# Patient Record
Sex: Male | Born: 1960 | Race: White | Hispanic: No | Marital: Married | Smoking: Never smoker
Health system: Southern US, Community
[De-identification: ages and names within clinical notes are randomized; demographics above are authoritative.]

## PROBLEM LIST (undated history)

## (undated) DIAGNOSIS — K5090 Crohn's disease, unspecified, without complications: Secondary | ICD-10-CM

## (undated) DIAGNOSIS — K648 Other hemorrhoids: Secondary | ICD-10-CM

## (undated) DIAGNOSIS — K635 Polyp of colon: Secondary | ICD-10-CM

## (undated) DIAGNOSIS — J329 Chronic sinusitis, unspecified: Secondary | ICD-10-CM

## (undated) DIAGNOSIS — I10 Essential (primary) hypertension: Secondary | ICD-10-CM

## (undated) DIAGNOSIS — K56699 Other intestinal obstruction unspecified as to partial versus complete obstruction: Secondary | ICD-10-CM

## (undated) DIAGNOSIS — J039 Acute tonsillitis, unspecified: Secondary | ICD-10-CM

## (undated) HISTORY — DX: Polyp of colon: K63.5

## (undated) HISTORY — DX: Chronic sinusitis, unspecified: J32.9

## (undated) HISTORY — DX: Acute tonsillitis, unspecified: J03.90

## (undated) HISTORY — PX: CHOLECYSTECTOMY: SHX55

## (undated) HISTORY — DX: Other hemorrhoids: K64.8

## (undated) HISTORY — DX: Crohn's disease, unspecified, without complications: K50.90

## (undated) HISTORY — DX: Other intestinal obstruction unspecified as to partial versus complete obstruction: K56.699

## (undated) HISTORY — DX: Essential (primary) hypertension: I10

---

## 2007-08-01 ENCOUNTER — Encounter: Admission: RE | Admit: 2007-08-01 | Discharge: 2007-08-01 | Payer: Self-pay | Admitting: Gastroenterology

## 2008-06-18 ENCOUNTER — Encounter: Admission: RE | Admit: 2008-06-18 | Discharge: 2008-06-18 | Payer: Self-pay | Admitting: Gastroenterology

## 2010-07-13 ENCOUNTER — Other Ambulatory Visit: Payer: Self-pay | Admitting: Gastroenterology

## 2010-07-13 DIAGNOSIS — K5090 Crohn's disease, unspecified, without complications: Secondary | ICD-10-CM

## 2010-08-03 ENCOUNTER — Other Ambulatory Visit: Payer: Self-pay

## 2010-08-15 ENCOUNTER — Other Ambulatory Visit: Payer: Self-pay

## 2010-08-23 ENCOUNTER — Ambulatory Visit
Admission: RE | Admit: 2010-08-23 | Discharge: 2010-08-23 | Disposition: A | Discharge status: Home / Self Care | Payer: 59 | Source: Ambulatory Visit | Attending: Gastroenterology | Admitting: Gastroenterology

## 2010-08-23 DIAGNOSIS — K5090 Crohn's disease, unspecified, without complications: Secondary | ICD-10-CM

## 2010-08-30 ENCOUNTER — Encounter (INDEPENDENT_AMBULATORY_CARE_PROVIDER_SITE_OTHER): Payer: Self-pay

## 2010-08-30 ENCOUNTER — Encounter (INDEPENDENT_AMBULATORY_CARE_PROVIDER_SITE_OTHER): Payer: Self-pay | Admitting: General Surgery

## 2010-08-30 ENCOUNTER — Ambulatory Visit (INDEPENDENT_AMBULATORY_CARE_PROVIDER_SITE_OTHER): Payer: 59 | Admitting: General Surgery

## 2010-08-30 VITALS — BP 128/82 | HR 88 | Ht 71.0 in | Wt 200.2 lb

## 2010-08-30 DIAGNOSIS — K5289 Other specified noninfective gastroenteritis and colitis: Secondary | ICD-10-CM

## 2010-08-30 DIAGNOSIS — K529 Noninfective gastroenteritis and colitis, unspecified: Secondary | ICD-10-CM

## 2010-08-30 NOTE — Progress Notes (Signed)
Herbert Cameron is a 50 y.o. male.    No chief complaint on file.   HPI HPI The patient comes to me with a history of a transverse colon stricture. I had seen the patient approximately 2 years ago for the same problem at which time he was completely asymptomatic. As the heart of his continuing followup she had a repeat virtual colonoscopy which demonstrated progression of the stricture in the transverse colon. They do have some concerns of a possible malignancy however this seems less likely since the patient didn't have an actual colonoscopy performed 2 years ago .  The patient continues to be completely asymptomatic. He has no abdominal pain, no bloating, no blood in his stools. However because of the progression of the stricturing on the virtual colonoscopy and the possibility of an occult malignancy we are recommending that he have a transverse colectomy.  Past Medical History  Diagnosis Date  . Crohn's disease   . Sinusitis   . Tonsillitis   . Colonic stricture   . Colon polyp   . Internal hemorrhoids     History reviewed. No pertinent past surgical history.  History reviewed. No pertinent family history.  Social History History  Substance Use Topics  . Smoking status: Never Smoker   . Smokeless tobacco: Not on file  . Alcohol Use: Not on file    Allergies  Allergen Reactions  . Codeine   . Iohexol      Code: HIVES, Desc: pt had iv contrast 26 yrs ago and broke out in hives and had a syncopal episode.  we did him w/o iv contrast today., Onset Date: [id number]   5  Current Outpatient Prescriptions  Medication Sig Dispense Refill  . mesalamine (LIALDA) 1.2 G EC tablet Take 1,200 mg by mouth daily with breakfast.        . finasteride (PROSCAR) 5 MG tablet       . ketoconazole (NIZORAL) 2 % shampoo         Review of Systems Review of Systems  Constitutional: Negative.   HENT: Negative.   Eyes: Negative.   Respiratory: Negative.   Cardiovascular: Negative.     Gastrointestinal: Negative for heartburn, nausea, vomiting, abdominal pain, diarrhea, constipation (Never any constipation), blood in stool (guaiac negative also) and melena.  Musculoskeletal: Negative.   Skin: Negative.   Neurological: Negative.   Endo/Heme/Allergies: Negative.   Psychiatric/Behavioral: Negative.     Physical Exam Physical Exam  Constitutional: He is oriented to person, place, and time. He appears well-developed and well-nourished.  HENT:  Head: Normocephalic and atraumatic.  Eyes: EOM are normal. Pupils are equal, round, and reactive to light.  Neck: Normal range of motion. Neck supple.  Cardiovascular: Normal rate, regular rhythm and normal heart sounds.   Respiratory: Effort normal and breath sounds normal.  GI: Soft. Bowel sounds are normal. He exhibits no distension and no mass. There is no tenderness. There is no rebound.  Genitourinary: Penis normal.  Musculoskeletal: Normal range of motion.  Neurological: He is alert and oriented to person, place, and time. He has normal reflexes.  Skin: Skin is warm and dry.  Psychiatric: He has a normal mood and affect. His behavior is normal. Judgment and thought content normal.     Blood pressure 128/82, pulse 88, height 5\' 11"  (1.803 m), weight 200 lb 3.2 oz (90.81 kg).  Assessment/Plan Currently the plan is to perform a right transverse colectomy. The patient will get a two-day bowel prep. He had been currently scheduled  for September 08, 2010   The plan is to perform a transverse colectomy the risks and benefits have been explained to the patient and his wife and we'll proceed  July Nickson III,Vicent Febles O 08/30/2010, 2:39 PM

## 2010-09-05 ENCOUNTER — Encounter (HOSPITAL_COMMUNITY)
Admission: RE | Admit: 2010-09-05 | Discharge: 2010-09-05 | Disposition: A | Discharge status: Home / Self Care | Payer: 59 | Source: Ambulatory Visit | Attending: General Surgery | Admitting: General Surgery

## 2010-09-05 ENCOUNTER — Other Ambulatory Visit (INDEPENDENT_AMBULATORY_CARE_PROVIDER_SITE_OTHER): Payer: Self-pay | Admitting: General Surgery

## 2010-09-05 ENCOUNTER — Ambulatory Visit (HOSPITAL_COMMUNITY)
Admission: RE | Admit: 2010-09-05 | Discharge: 2010-09-05 | Disposition: A | Discharge status: Home / Self Care | Payer: 59 | Source: Ambulatory Visit | Attending: General Surgery | Admitting: General Surgery

## 2010-09-05 DIAGNOSIS — Z01812 Encounter for preprocedural laboratory examination: Secondary | ICD-10-CM | POA: Insufficient documentation

## 2010-09-05 DIAGNOSIS — Z01810 Encounter for preprocedural cardiovascular examination: Secondary | ICD-10-CM | POA: Insufficient documentation

## 2010-09-05 DIAGNOSIS — Z01818 Encounter for other preprocedural examination: Secondary | ICD-10-CM | POA: Insufficient documentation

## 2010-09-05 DIAGNOSIS — K56609 Unspecified intestinal obstruction, unspecified as to partial versus complete obstruction: Secondary | ICD-10-CM | POA: Insufficient documentation

## 2010-09-05 DIAGNOSIS — K56699 Other intestinal obstruction unspecified as to partial versus complete obstruction: Secondary | ICD-10-CM

## 2010-09-05 LAB — CBC
HCT: 42.1 % (ref 39.0–52.0)
Hemoglobin: 14.7 g/dL (ref 13.0–17.0)
MCH: 31.7 pg (ref 26.0–34.0)
MCHC: 34.9 g/dL (ref 30.0–36.0)
MCV: 90.7 fL (ref 78.0–100.0)
Platelets: 302 10*3/uL (ref 150–400)
RBC: 4.64 MIL/uL (ref 4.22–5.81)
RDW: 12.6 % (ref 11.5–15.5)
WBC: 6.7 10*3/uL (ref 4.0–10.5)

## 2010-09-05 LAB — COMPREHENSIVE METABOLIC PANEL
ALT: 16 U/L (ref 0–53)
AST: 16 U/L (ref 0–37)
Albumin: 3.8 g/dL (ref 3.5–5.2)
Alkaline Phosphatase: 74 U/L (ref 39–117)
BUN: 16 mg/dL (ref 6–23)
CO2: 27 mEq/L (ref 19–32)
Calcium: 9.5 mg/dL (ref 8.4–10.5)
Chloride: 104 mEq/L (ref 96–112)
Creatinine, Ser: 0.84 mg/dL (ref 0.50–1.35)
GFR calc Af Amer: >60 mL/min (ref >60)
GFR calc non Af Amer: >60 mL/min (ref >60)
Glucose, Bld: 96 mg/dL (ref 70–99)
Potassium: 5.3 mEq/L — ABNORMAL HIGH (ref 3.5–5.1)
Sodium: 139 mEq/L (ref 135–145)
Total Bilirubin: 0.4 mg/dL (ref 0.3–1.2)
Total Protein: 6.8 g/dL (ref 6.0–8.3)

## 2010-09-05 LAB — TYPE AND SCREEN: Antibody Screen: NEGATIVE

## 2010-09-05 LAB — PROTIME-INR: Prothrombin Time: 13.3 seconds (ref 11.6–15.2)

## 2010-09-05 LAB — SURGICAL PCR SCREEN: MRSA, PCR: NEGATIVE

## 2010-09-05 LAB — DIFFERENTIAL
Basophils Relative: 0 % (ref 0–1)
Lymphs Abs: 0.9 10*3/uL (ref 0.7–4.0)
Monocytes Relative: 7 % (ref 3–12)
Neutro Abs: 5.2 10*3/uL (ref 1.7–7.7)
Neutrophils Relative %: 77 % (ref 43–77)

## 2010-09-05 LAB — ABO/RH: ABO/RH(D): B NEG

## 2010-09-08 ENCOUNTER — Other Ambulatory Visit (INDEPENDENT_AMBULATORY_CARE_PROVIDER_SITE_OTHER): Payer: Self-pay | Admitting: General Surgery

## 2010-09-08 ENCOUNTER — Inpatient Hospital Stay (HOSPITAL_COMMUNITY)
Admission: RE | Admit: 2010-09-08 | Discharge: 2010-09-13 | DRG: 330 | Disposition: A | Discharge status: Home / Self Care | Payer: 59 | Source: Ambulatory Visit | Attending: General Surgery | Admitting: General Surgery

## 2010-09-08 DIAGNOSIS — Z860100 Personal history of colon polyps, unspecified: Secondary | ICD-10-CM

## 2010-09-08 DIAGNOSIS — K429 Umbilical hernia without obstruction or gangrene: Secondary | ICD-10-CM | POA: Diagnosis present

## 2010-09-08 DIAGNOSIS — K5010 Crohn's disease of large intestine without complications: Principal | ICD-10-CM | POA: Diagnosis present

## 2010-09-08 DIAGNOSIS — Z01810 Encounter for preprocedural cardiovascular examination: Secondary | ICD-10-CM

## 2010-09-08 DIAGNOSIS — Z01812 Encounter for preprocedural laboratory examination: Secondary | ICD-10-CM

## 2010-09-08 DIAGNOSIS — K5090 Crohn's disease, unspecified, without complications: Secondary | ICD-10-CM

## 2010-09-08 DIAGNOSIS — Z01818 Encounter for other preprocedural examination: Secondary | ICD-10-CM

## 2010-09-08 DIAGNOSIS — K56609 Unspecified intestinal obstruction, unspecified as to partial versus complete obstruction: Secondary | ICD-10-CM | POA: Diagnosis present

## 2010-09-08 DIAGNOSIS — K648 Other hemorrhoids: Secondary | ICD-10-CM | POA: Diagnosis present

## 2010-09-08 DIAGNOSIS — K660 Peritoneal adhesions (postprocedural) (postinfection): Secondary | ICD-10-CM | POA: Diagnosis present

## 2010-09-09 LAB — CBC
HCT: 39.4 % (ref 39.0–52.0)
Hemoglobin: 13.9 g/dL (ref 13.0–17.0)
RBC: 4.53 MIL/uL (ref 4.22–5.81)
WBC: 20.2 10*3/uL — ABNORMAL HIGH (ref 4.0–10.5)

## 2010-09-09 LAB — BASIC METABOLIC PANEL
BUN: 16 mg/dL (ref 6–23)
Chloride: 99 mEq/L (ref 96–112)
GFR calc non Af Amer: >60 mL/min (ref >60)
Glucose, Bld: 269 mg/dL — ABNORMAL HIGH (ref 70–99)
Potassium: 5.6 mEq/L — ABNORMAL HIGH (ref 3.5–5.1)
Sodium: 131 mEq/L — ABNORMAL LOW (ref 135–145)

## 2010-09-09 LAB — DIFFERENTIAL
Basophils Absolute: 0 10*3/uL (ref 0.0–0.1)
Basophils Relative: 0 % (ref 0–1)
Lymphocytes Relative: 2 % — ABNORMAL LOW (ref 12–46)
Neutro Abs: 17.5 10*3/uL — ABNORMAL HIGH (ref 1.7–7.7)
Neutrophils Relative %: 87 % — ABNORMAL HIGH (ref 43–77)

## 2010-09-10 LAB — CBC
HCT: 33 % — ABNORMAL LOW (ref 39.0–52.0)
Hemoglobin: 11.5 g/dL — ABNORMAL LOW (ref 13.0–17.0)
MCH: 31 pg (ref 26.0–34.0)
MCHC: 34.8 g/dL (ref 30.0–36.0)
RBC: 3.71 MIL/uL — ABNORMAL LOW (ref 4.22–5.81)

## 2010-09-10 LAB — BASIC METABOLIC PANEL
BUN: 13 mg/dL (ref 6–23)
CO2: 30 mEq/L (ref 19–32)
GFR calc non Af Amer: >60 mL/min (ref >60)
Glucose, Bld: 141 mg/dL — ABNORMAL HIGH (ref 70–99)
Potassium: 4.6 mEq/L (ref 3.5–5.1)
Sodium: 134 mEq/L — ABNORMAL LOW (ref 135–145)

## 2010-09-11 NOTE — Op Note (Signed)
NAMEDEMETRY, BENDICKSON              ACCOUNT NO.:  [id number]  MEDICAL RECORD NO.:  [id number]  LOCATION:  5123                         FACILITY:  [facility]  PHYSICIAN:  Cherylynn Ridges, M.D.    DATE OF BIRTH:  1960-08-11  DATE OF PROCEDURE: DATE OF DISCHARGE:                              OPERATIVE REPORT   Herbert patient of Dr. Charna Cameron.  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  PROCEDURE:  Transverse colectomy with primary anastomosis and mobilization of splenic flexure.  SURGEON:  Cherylynn Ridges, MD  ASSISTANT:  Dr. Magnus Ivan.  ANESTHESIA:  General endotracheal.  ESTIMATED BLOOD LOSS:  100 mL.  COMPLICATIONS:  None.  CONDITION:  Stable.  FINDINGS:  Markedly inflamed and strictured midtransverse colon without evidence of acute perforation.  INDICATIONS FOR OPERATION:  Herbert patient is a 50 year old with minimal symptoms who comes in with a known transverse colon stricture which has worsened over last year who now comes in for transverse colectomy.  OPERATION:  Herbert patient was taken to Herbert operating room and placed on table in supine position.  After an adequate general endotracheal anesthetic was administered, he was prepped and draped in usual sterile manner exposing Herbert midline of Herbert abdomen.  After proper time-out was performed identifying Herbert patient and Herbert procedure to be done, we made a midline incision from Herbert umbilicus from Herbert xiphoid just down to Herbert right of Herbert umbilicus.  Herbert patient was known to have an umbilical hernia and this was incorporated into Herbert closure for repair Herbert umbilical hernia defect.  Upon entering Herbert peritoneal cavity, Herbert patient have what appeared to be significant inflammation and omental adhesions to Herbert midtransverse colon.  Mobilizing this require taking down Herbert omentum and leaving some attached  to Herbert strictured and inflamed area and also mobilizing Herbert splenic flexure.  During that process, there was a very small less than a centimeter tear of Herbert splenic capsule on Herbert edge which was cauterized and covered with Surgicel SNoW.  We mobilized Herbert splenic flexure and then also mobilized Herbert transverse colon from Herbert omentum centrally and then also mobilized right colon down from Herbert hepatic flexure.  We used a GIA proximally and distal to this strictured area after mobilizing Herbert mesentery of Herbert transverse colon.  This left Herbert mesentery as Herbert only attachment to Herbert body after we had taken down Herbert omental adhesions.  We used a LigaSure device and also 2-0 silk ties in order to mobilize Herbert mesentery and then was resected specimen.  Once this was done again mobilizing Herbert splenic flexure in Herbert right colon was made sure in order to perform a side-to- side anastomosis using a GIA 75 stapler.  Herbert resulting enterotomy was closed using a TA-60 stapler.  We closed Herbert mesentery using interrupted 2-0 silk sutures.  Once this was done, we irrigated with saline after changing our gloves as Herbert assistant and Herbert surgeon.  There was no tension on Herbert anastomosis at all.  We inspected all areas.  Exploration did not demonstrate any evidence of any tumor anywhere, no evidence of anything in  Herbert liver and Herbert gallbladder palpated as normal.  A small tear on Herbert splenic capsule was not bleeding with pressure and with a small piece of Surgicel SNoW.  We irrigated with saline solution and then closed.  We closed Herbert fascia using running looped #1 PDS suture again incorporating Herbert previous hernia, umbilical hernia defect into Herbert abdominal closure.  We irrigated subcu with saline and then we closed Herbert skin using a running subcuticular stitch of 3-0 Monocryl after injecting Herbert wound with 20 mL of 0.25% Marcaine with epinephrine.  All counts were correct including needles, sponges and  instruments.     Cherylynn Ridges, M.D.     JOW/MEDQ  D:  09/08/2010  T:  09/09/2010  Job:  [id number]  cc:   Anselmo Rod, MD, [facility]  Electronically Signed by Jimmye Norman M.D. on 09/11/2010 03:33:36 PM

## 2010-09-12 LAB — DIFFERENTIAL
Basophils Absolute: 0 10*3/uL (ref 0.0–0.1)
Basophils Relative: 0 % (ref 0–1)
Eosinophils Absolute: 0.1 10*3/uL (ref 0.0–0.7)
Eosinophils Relative: 2 % (ref 0–5)
Lymphocytes Relative: 15 % (ref 12–46)
Lymphs Abs: 0.8 10*3/uL (ref 0.7–4.0)
Monocytes Absolute: 0.6 10*3/uL (ref 0.1–1.0)
Monocytes Relative: 11 % (ref 3–12)
Neutro Abs: 4 10*3/uL (ref 1.7–7.7)
Neutrophils Relative %: 72 % (ref 43–77)

## 2010-09-12 LAB — CBC
HCT: 30.9 % — ABNORMAL LOW (ref 39.0–52.0)
Hemoglobin: 10.6 g/dL — ABNORMAL LOW (ref 13.0–17.0)
MCH: 30.9 pg (ref 26.0–34.0)
MCHC: 34.3 g/dL (ref 30.0–36.0)
MCV: 90.1 fL (ref 78.0–100.0)
RDW: 12.3 % (ref 11.5–15.5)

## 2010-09-12 LAB — BASIC METABOLIC PANEL
BUN: 7 mg/dL (ref 6–23)
Chloride: 102 mEq/L (ref 96–112)
GFR calc Af Amer: >60 mL/min (ref >60)
GFR calc non Af Amer: >60 mL/min (ref >60)
Potassium: 4.6 mEq/L (ref 3.5–5.1)
Sodium: 139 mEq/L (ref 135–145)

## 2010-09-13 ENCOUNTER — Telehealth (INDEPENDENT_AMBULATORY_CARE_PROVIDER_SITE_OTHER): Payer: Self-pay | Admitting: General Surgery

## 2010-09-26 ENCOUNTER — Encounter (INDEPENDENT_AMBULATORY_CARE_PROVIDER_SITE_OTHER): Payer: Self-pay | Admitting: General Surgery

## 2010-09-26 ENCOUNTER — Ambulatory Visit (INDEPENDENT_AMBULATORY_CARE_PROVIDER_SITE_OTHER): Payer: 59 | Admitting: General Surgery

## 2010-09-26 DIAGNOSIS — Z09 Encounter for follow-up examination after completed treatment for conditions other than malignant neoplasm: Secondary | ICD-10-CM

## 2010-09-26 NOTE — Progress Notes (Signed)
HPI The patient is status post transverse colectomy for an inflammatory stricture of his mid transverse colon pathology has revealed that this is likely an inflammatory acute and chronic process possibly Crohn's disease.  PE On examination today he has some mild left upper quadrant tenderness but good bowel sounds. His midline wound has healed well with no evidence of infection and there is no drainage I removed a bandage an incision looks well-healed subcuticular midline incision.  Studiy review None  Assessment Doing well status post transverse colectomy with primary anastomosis. The patient did have several days of gaseous buildup which is treated with likely symmetrical by Dr. Loreta Ave to  Plan Our reassess the patient in 2 weeks at which time I will give him his work release at that time which will be October 30, 2010 at that time he will be able to lift greater than 25 pounds.

## 2010-10-17 ENCOUNTER — Encounter (INDEPENDENT_AMBULATORY_CARE_PROVIDER_SITE_OTHER): Payer: Self-pay | Admitting: General Surgery

## 2010-10-17 ENCOUNTER — Ambulatory Visit (INDEPENDENT_AMBULATORY_CARE_PROVIDER_SITE_OTHER): Payer: 59 | Admitting: General Surgery

## 2010-10-17 VITALS — BP 126/88 | HR 66

## 2010-10-17 DIAGNOSIS — Z09 Encounter for follow-up examination after completed treatment for conditions other than malignant neoplasm: Secondary | ICD-10-CM

## 2010-10-17 NOTE — Progress Notes (Signed)
Addended by: Cherylynn Ridges III on: 10/17/2010 01:18 PM   Modules accepted: Orders, Medications

## 2010-10-17 NOTE — Progress Notes (Signed)
HPI The patient is status post transverse colectomy and doing very well. He averages 2-3 bowel movements a day. He has no problems with constipation.  PE His midline wound has healed very well with no evidence of infection and or hernia formation.  Studiy review None.  Assessment  Status post transverse colectomy.  Plan I will see the patient on a p.r.n. basis.

## 2010-11-28 NOTE — Discharge Summary (Signed)
  NAMETROI, FLORENDO              ACCOUNT NO.:  [id number]  MEDICAL RECORD NO.:  [id number]  LOCATION:  5123                         FACILITY:  [facility]  PHYSICIAN:  Cherylynn Ridges, M.D.    DATE OF BIRTH:  October 17, 1960  DATE OF ADMISSION:  09/08/2010 DATE OF DISCHARGE:  09/13/2010                              DISCHARGE SUMMARY   DISCHARGE DIAGNOSIS:  Chronic active colitis with stricture consistent with Crohn disease.  PRINCIPAL PROCEDURE:  Mid to right transverse colectomy with primary anastomosis.  SURGEON:  Cherylynn Ridges, M.D.  DISCHARGE MEDICATIONS:  He was discharged home on Ultram and Tylenol p.r.n. for pain.  DISCHARGE DIET:  Soft.  CONDITION:  Stable.  BRIEF SUMMARY OF HOSPITAL COURSE:  The patient was admitted with a known high-grade stricture of his mid transverse colon.  It was felt for a long time this is consistent with Crohn disease; however, biopsies only confirmed inflammatory changes.  He was brought in for an elective colectomy and this was done on the day of admission on September 08, 2010, at which time, we did admit, had transverse colectomy.  He had closure with absorbable stitches.  Postoperatively, he did well; although, he did have some problems of pain control initially.  He was on Alvimopan, started to have bowel movements on postop day #3 and did well subsequently.  He was discharged home in the care of his family and to follow up to see Dr. Lindie Spruce in 2 weeks.     Cherylynn Ridges, M.D.     JOW/MEDQ  D:  11/02/2010  T:  11/02/2010  Job:  [id number]  Electronically Signed by Jimmye Norman M.D. on 11/28/2010 07:25:39 AM

## 2010-12-26 ENCOUNTER — Encounter (INDEPENDENT_AMBULATORY_CARE_PROVIDER_SITE_OTHER): Payer: Self-pay | Admitting: Gastroenterology

## 2012-11-14 IMAGING — CR DG CHEST 2V
2 series · 2 of 2 positions shown · non-contrast
Comparison: None

CLINICAL DATA: Preop colon surgery.

CHEST - 2 VIEW

[view not recorded (1 of 2)]
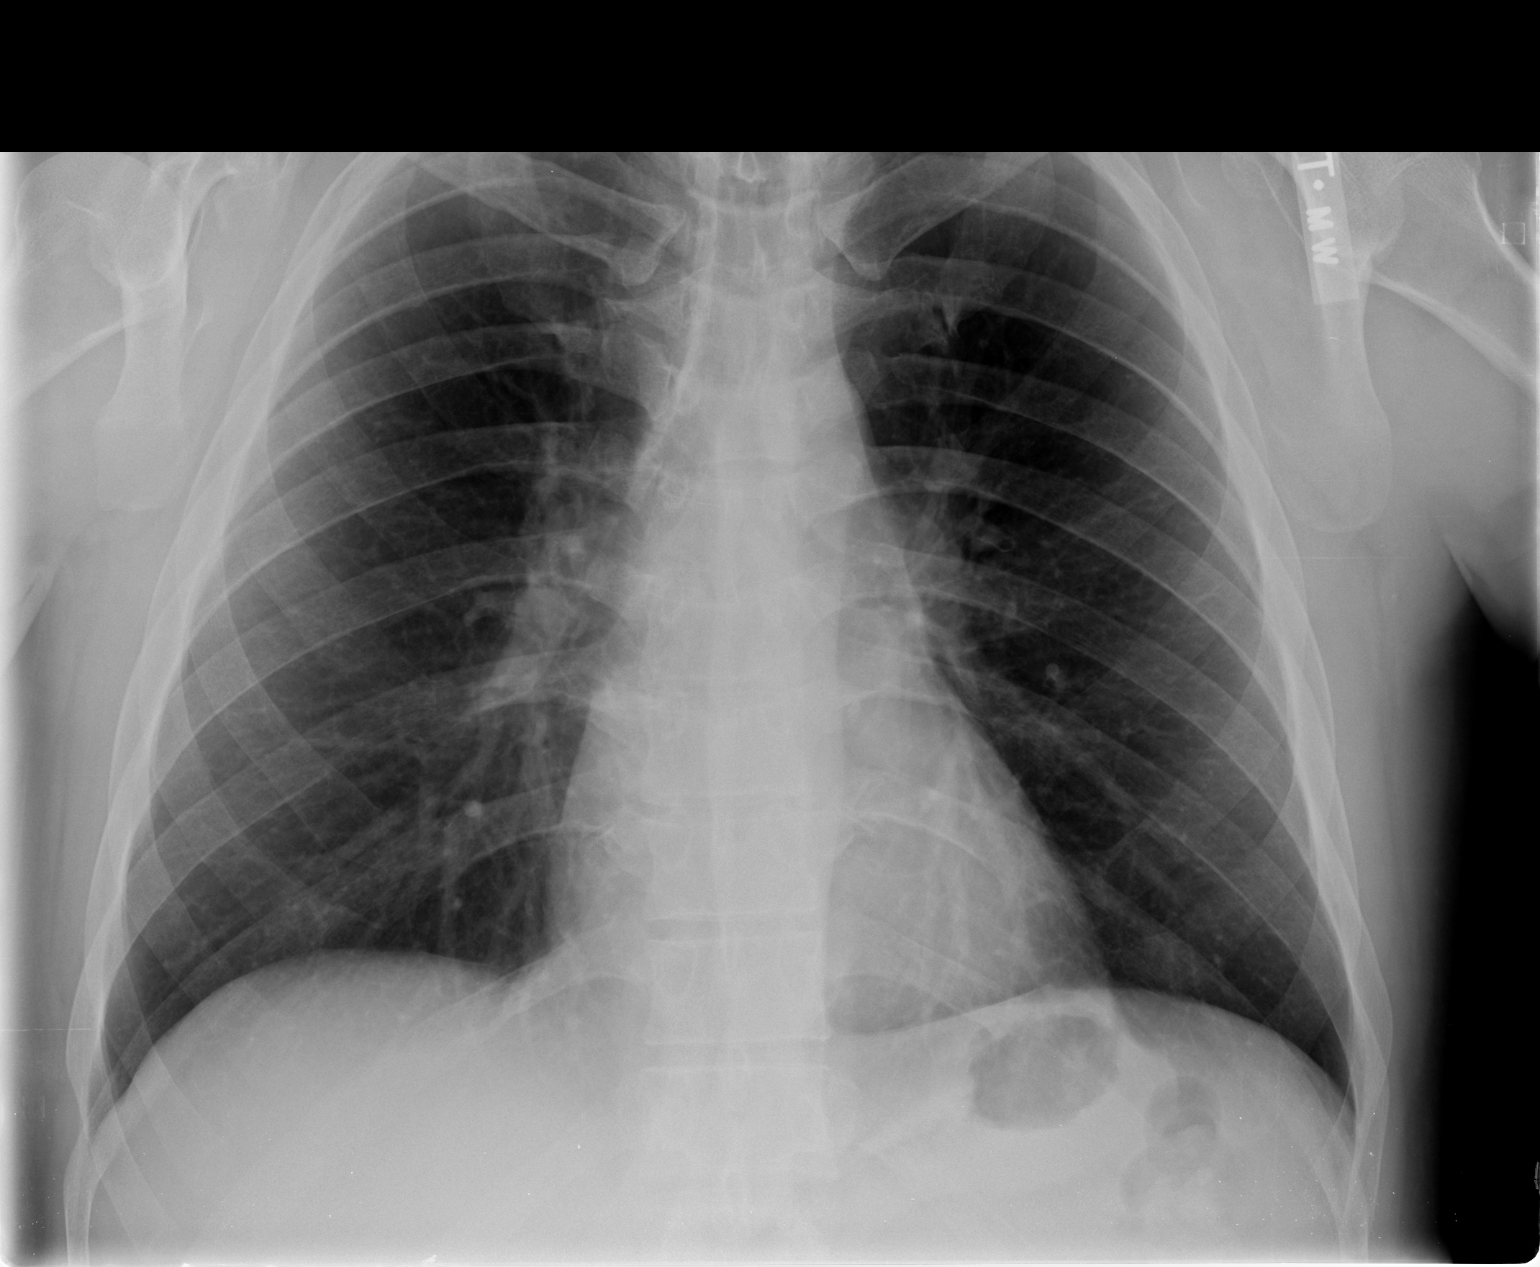

[view not recorded (2 of 2)]
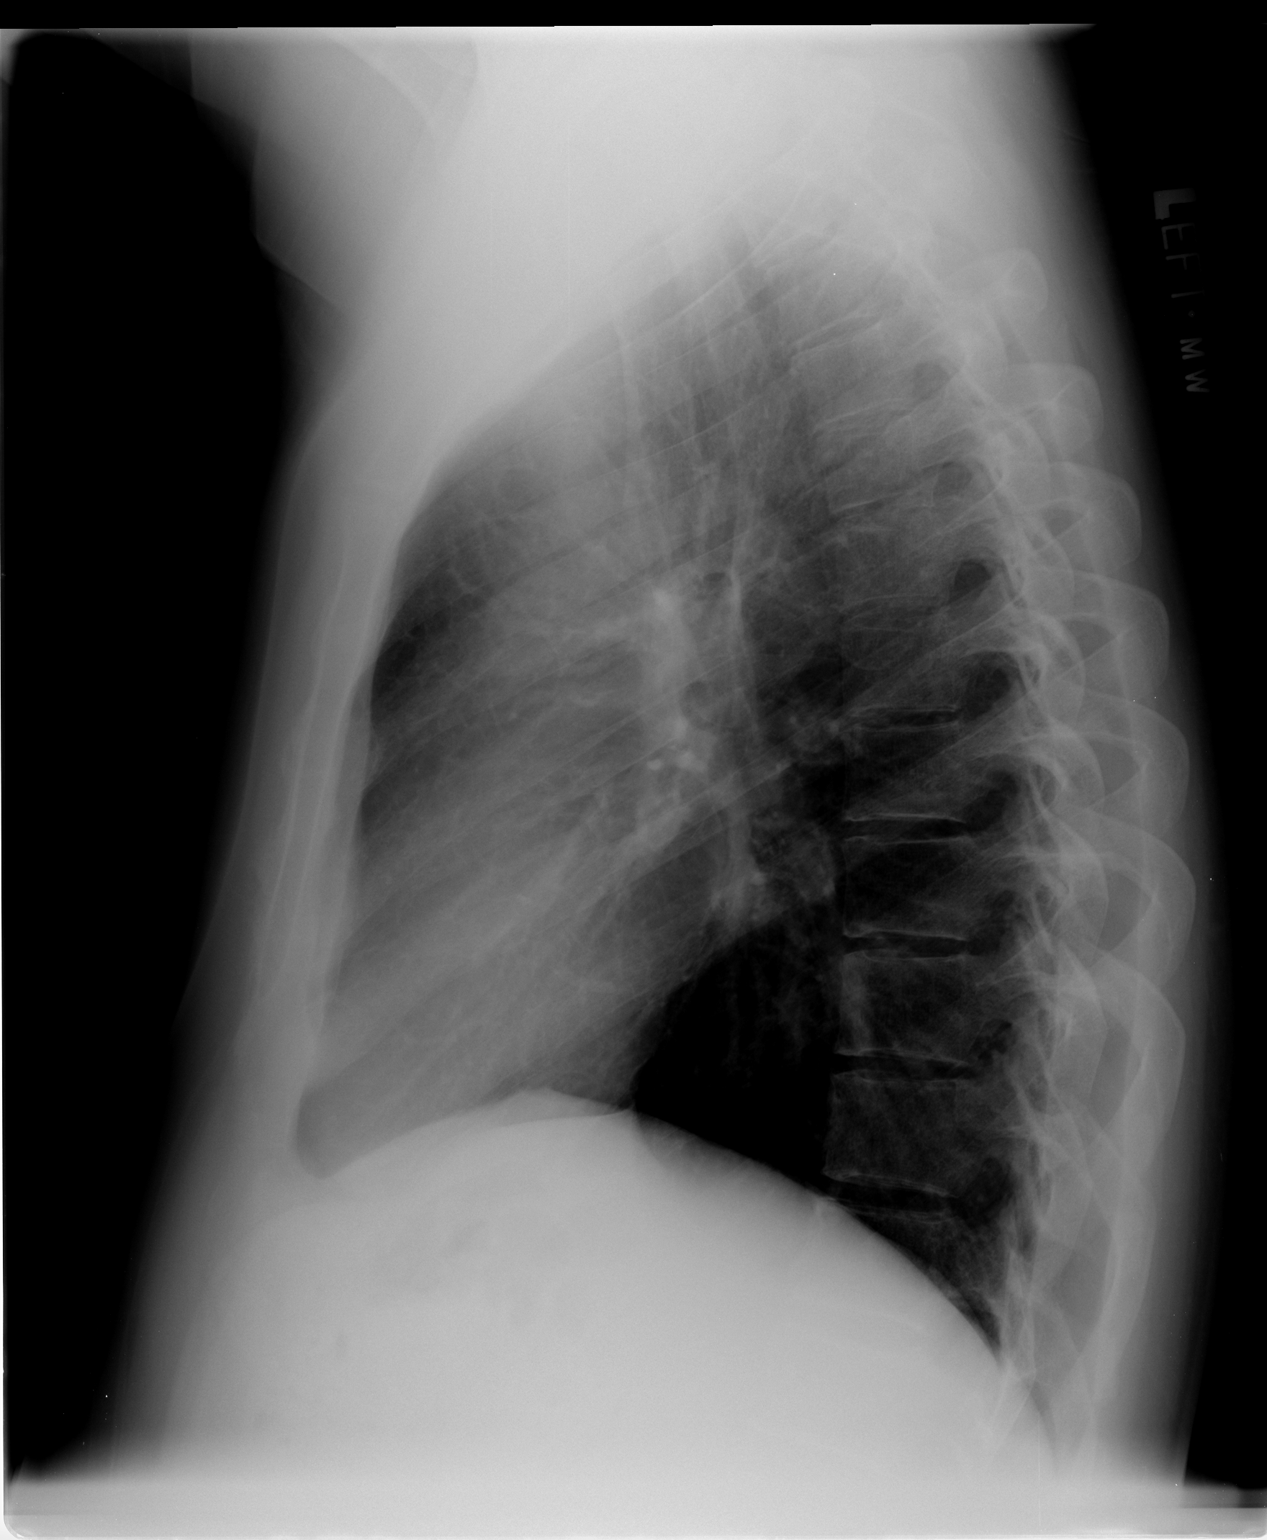

[2 of 2 positions shown; findings below may reference images not displayed]

FINDINGS: Heart and mediastinal contours are within normal limits.
No focal opacities or effusions.  No acute bony abnormality.
IMPRESSION: No active disease.

## 2018-07-10 ENCOUNTER — Encounter: Payer: Self-pay | Admitting: Cardiology

## 2018-07-10 ENCOUNTER — Other Ambulatory Visit: Payer: Self-pay

## 2018-07-10 ENCOUNTER — Ambulatory Visit: Payer: BLUE CROSS/BLUE SHIELD | Admitting: Cardiology

## 2018-07-10 VITALS — BP 148/90 | HR 70 | Temp 98.4°F | Ht 72.0 in | Wt 211.0 lb

## 2018-07-10 DIAGNOSIS — I10 Essential (primary) hypertension: Secondary | ICD-10-CM | POA: Diagnosis not present

## 2018-07-10 DIAGNOSIS — R079 Chest pain, unspecified: Secondary | ICD-10-CM | POA: Insufficient documentation

## 2018-07-10 DIAGNOSIS — R0609 Other forms of dyspnea: Secondary | ICD-10-CM

## 2018-07-10 MED ORDER — LOSARTAN POTASSIUM 50 MG PO TABS: 50.0000 mg | ORAL_TABLET | Freq: Every day | ORAL | 2 refills | Status: DC

## 2018-07-10 NOTE — Progress Notes (Signed)
Patient referred by Audley Hose, MD for dyspnea on exertion  Subjective:   Herbert Cameron, male    DOB: 11/29/1960, 58 y.o.   MRN: [id number]   Chief Complaint  Patient presents with  . Shortness of Breath    pt complains of SOB/Chest pain, pt been tested for COVID-19  . Chest Pain     HPI  58 y.o. Caucasian male  male with hypertension, referred for evaluation of exertional chest pain shortness of breath.  Patient is an Lawyer.  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  Starting first week of April, his flights were grounded.  Around that time, he did have exposure to cope with positive pilots around him.  He was quarantined in a rental apartment in [place] from April 6 through April 24.  Around this time, he noticed exertional chest tightness and shortness of breath with his usual activity of walking 3 to 5 miles daily.  He had dry cough but did not have any fever.  He underwent antibody testing for COVID through his PCP on 06/19/2018, which was normal.  While his symptoms have now resolved, given his high risk profession and hypertension, he was referred for further evaluation.  He is currently taking losartan 25 mg daily for last 5 days.  Blood pressure remains elevated.  He is also on rosuvastatin 5 mg daily.    Past Medical History:  Diagnosis Date  . Colon polyp   . Colonic stricture   . Crohn's disease   . Internal hemorrhoids   . Sinusitis   . Tonsillitis      Past Surgical History:  Procedure Laterality Date  . CHOLECYSTECTOMY       Social History   Socioeconomic History  . Marital status: Married    Spouse name: Not on file  . Number of children: Not on file  . Years of education: Not on file  . Highest education level: Not on file  Occupational History  . Not on file  Social Needs  . Financial resource strain: Not on file  . Food insecurity:    Worry: Not on file   Inability: Not on file  . Transportation needs:    Medical: Not on file    Non-medical: Not on file  Tobacco Use  . Smoking status: Never Smoker  Substance and Sexual Activity  . Alcohol use: No  . Drug use: No  . Sexual activity: Not on file  Lifestyle  . Physical activity:    Days per week: Not on file    Minutes per session: Not on file  . Stress: Not on file  Relationships  . Social connections:    Talks on phone: Not on file    Gets together: Not on file    Attends religious service: Not on file    Active member of club or organization: Not on file    Attends meetings of clubs or organizations: Not on file    Relationship status: Not on file  . Intimate partner violence:    Fear of current or ex partner: Not on file    Emotionally abused: Not on file    Physically abused: Not on file    Forced sexual activity: Not on file  Other Topics Concern  . Not on file  Social History Narrative  . Not on file     Family History  Problem Relation Age of Onset  . Emphysema  Mother   . Testicular cancer Father      Current Outpatient Medications on File Prior to Visit  Medication Sig Dispense Refill  . finasteride (PROSCAR) 5 MG tablet     . hyoscyamine (LEVSIN SL) 0.125 MG SL tablet as needed.    Marland Kitchen ketoconazole (NIZORAL) 2 % shampoo      No current facility-administered medications on file prior to visit.     Cardiovascular studies:  EKG 07/10/2018: Sinus rhythm 68 bpm. Incomplete RBBB.  Recent labs: 06/19/2018: Chol 217, TG 62, HDL 74, LDL 131.  SARS-CoV-2 Ab: Negative  02/20/2018: Glucose 102. BUN/Cr 14/1.0. eGFR 78/90. Na/K 140/4.2 H/H 14/41. MCV 89. Platelets 305. Chol 231, TG 61, HDL 71, LDL 148. HbA1C 5.2%   Review of Systems  Constitution: Negative for decreased appetite, fever, malaise/fatigue, weight gain and weight loss.  HENT: Negative for congestion.   Eyes: Negative for visual disturbance.  Cardiovascular: Positive for chest pain and dyspnea  on exertion. Negative for leg swelling, palpitations and syncope.  Respiratory: Positive for shortness of breath. Negative for cough.   Endocrine: Negative for cold intolerance.  Hematologic/Lymphatic: Does not bruise/bleed easily.  Skin: Negative for itching and rash.  Musculoskeletal: Negative for myalgias.  Gastrointestinal: Negative for abdominal pain, nausea and vomiting.  Genitourinary: Negative for dysuria.  Neurological: Negative for dizziness and weakness.  Psychiatric/Behavioral: The patient is not nervous/anxious.   All other systems reviewed and are negative.        Vitals:   07/10/18 0908  BP: (!) 148/90  Pulse: 70  Temp: 98.4 F (36.9 C)  SpO2: 98%     Body mass index is 28.62 kg/m. There were no vitals filed for this visit.  Objective:   Physical Exam  Constitutional: He is oriented to person, place, and time. He appears well-developed and well-nourished. No distress.  HENT:  Head: Normocephalic and atraumatic.  Eyes: Pupils are equal, round, and reactive to light. Conjunctivae are normal.  Neck: No JVD present.  Cardiovascular: Normal rate, regular rhythm and intact distal pulses.  No murmur heard. Pulmonary/Chest: Effort normal and breath sounds normal. He has no wheezes. He has no rales.  Abdominal: Soft. Bowel sounds are normal. There is no rebound.  Musculoskeletal:        General: No edema.  Lymphadenopathy:    He has no cervical adenopathy.  Neurological: He is alert and oriented to person, place, and time. No cranial nerve deficit.  Skin: Skin is warm and dry.  Psychiatric: He has a normal mood and affect.  Nursing note and vitals reviewed.         Assessment & Recommendations:   58 y.o. Caucasian male  male with hypertension, referred for evaluation of exertional chest pain shortness of breath.  1. DOE (dyspnea on exertion) & Exertional chest pain Symptoms occurred in April 2020, and have subsequently resolved.  While COVID remains  in differential, his antibody testing in early May was negative.  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  2.  Hypertension: Suboptimal control.  Increase his losartan to 50 mg daily.  Continue follow-up with PCP.  I will see him on as-needed basis, unless significant abnormalities noted on nuclear stress testing.   Thank you for referring the patient to [place]. Please feel free to contact with any questions.  Nigel Mormon, MD [facility] Pager: [phone] Office: [phone] If no answer Cell [phone]

## 2018-07-11 ENCOUNTER — Other Ambulatory Visit: Payer: Self-pay

## 2018-07-11 ENCOUNTER — Ambulatory Visit (INDEPENDENT_AMBULATORY_CARE_PROVIDER_SITE_OTHER): Payer: BLUE CROSS/BLUE SHIELD

## 2018-07-11 DIAGNOSIS — R0789 Other chest pain: Secondary | ICD-10-CM | POA: Diagnosis not present

## 2018-07-11 DIAGNOSIS — R079 Chest pain, unspecified: Secondary | ICD-10-CM

## 2018-07-14 NOTE — Progress Notes (Signed)
Stress test does not show any heart circulation abnormalities. I do not have any cardiac restrictions for him flying.

## 2018-07-15 ENCOUNTER — Telehealth: Payer: Self-pay

## 2018-07-15 NOTE — Telephone Encounter (Signed)
No one has your message, not sure where you sent it but pt is aware. Thank you.//ah

## 2018-07-15 NOTE — Telephone Encounter (Signed)
Pt called stating that you were supposed to get with him yesterday regarding his lexi results. Please advise or call pt.//ah

## 2018-07-15 NOTE — Telephone Encounter (Signed)
Please check your messages. I sent message about his lexi over the weekend. Normal stress test. Can resume flying.  Thanks MJP

## 2018-09-29 ENCOUNTER — Other Ambulatory Visit: Payer: Self-pay | Admitting: Cardiology

## 2019-01-23 ENCOUNTER — Encounter (HOSPITAL_BASED_OUTPATIENT_CLINIC_OR_DEPARTMENT_OTHER): Payer: Self-pay

## 2019-01-23 DIAGNOSIS — R0683 Snoring: Secondary | ICD-10-CM

## 2019-03-23 ENCOUNTER — Encounter (HOSPITAL_BASED_OUTPATIENT_CLINIC_OR_DEPARTMENT_OTHER): Payer: BC Managed Care – PPO | Admitting: Internal Medicine

## 2019-05-25 ENCOUNTER — Encounter (HOSPITAL_BASED_OUTPATIENT_CLINIC_OR_DEPARTMENT_OTHER): Payer: BC Managed Care – PPO | Admitting: Internal Medicine

## 2019-06-16 ENCOUNTER — Ambulatory Visit
Admission: RE | Admit: 2019-06-16 | Discharge: 2019-06-16 | Disposition: A | Discharge status: Home / Self Care | Payer: BC Managed Care – PPO | Source: Ambulatory Visit | Attending: Internal Medicine | Admitting: Internal Medicine

## 2019-06-16 ENCOUNTER — Other Ambulatory Visit: Payer: Self-pay

## 2019-06-16 ENCOUNTER — Other Ambulatory Visit: Payer: Self-pay | Admitting: Internal Medicine

## 2019-06-16 DIAGNOSIS — M19041 Primary osteoarthritis, right hand: Secondary | ICD-10-CM

## 2019-08-14 ENCOUNTER — Encounter (HOSPITAL_BASED_OUTPATIENT_CLINIC_OR_DEPARTMENT_OTHER): Payer: BC Managed Care – PPO | Admitting: Internal Medicine

## 2020-01-12 ENCOUNTER — Other Ambulatory Visit (HOSPITAL_COMMUNITY): Payer: Self-pay | Admitting: Family

## 2020-01-12 ENCOUNTER — Telehealth (HOSPITAL_COMMUNITY): Payer: Self-pay | Admitting: Emergency Medicine

## 2020-01-12 DIAGNOSIS — U071 COVID-19: Secondary | ICD-10-CM

## 2020-01-12 NOTE — Progress Notes (Signed)
I connected by phone with Herbert Cameron on 01/12/2020 at 5:38 PM to discuss the potential use of a new treatment for mild to moderate COVID-19 viral infection in non-hospitalized patients.  This patient is a 59 y.o. male that meets the FDA criteria for Emergency Use Authorization of COVID monoclonal antibody casirivimab/imdevimab, bamlanivimab/eteseviamb, or sotrovimab.  Has a (+) direct SARS-CoV-2 viral test result  Has mild or moderate COVID-19   Is NOT hospitalized due to COVID-19  Is within 10 days of symptom onset  Has at least one of the high risk factor(s) for progression to severe COVID-19 and/or hospitalization as defined in EUA.  Specific high risk criteria : BMI > 25 and Cardiovascular disease or hypertension   Symptoms of fever, H/A, cough, nausea, vomiting, diarrhea began 01/08/20   I have spoken and communicated the following to the patient or parent/caregiver regarding COVID monoclonal antibody treatment:  1. FDA has authorized the emergency use for the treatment of mild to moderate COVID-19 in adults and pediatric patients with positive results of direct SARS-CoV-2 viral testing who are 26 years of age and older weighing at least 40 kg, and who are at high risk for progressing to severe COVID-19 and/or hospitalization.  2. The significant known and potential risks and benefits of COVID monoclonal antibody, and the extent to which such potential risks and benefits are unknown.  3. Information on available alternative treatments and the risks and benefits of those alternatives, including clinical trials.  4. Patients treated with COVID monoclonal antibody should continue to self-isolate and use infection control measures (e.g., wear mask, isolate, social distance, avoid sharing personal items, clean and disinfect "high touch" surfaces, and frequent handwashing) according to CDC guidelines.   5. The patient or parent/caregiver has the option to accept or refuse COVID  monoclonal antibody treatment.  After reviewing this information with the patient, the patient has agreed to receive one of the available covid 19 monoclonal antibodies and will be provided an appropriate fact sheet prior to infusion. Morton Stall, NP 01/12/2020 5:38 PM

## 2020-01-12 NOTE — Telephone Encounter (Signed)
Called pt and explained possible monoclonal antibody treatment. Sx started 11/26. Tested positive 11/28 at [facility] on [address]. Will bring copy of test results. Sx include fever, cough, headache, chills, nausea, vomiting, diarrhea, and fatigue. Qualifying risk factors include HTN and BMI is 27.8. Fully vaccinated with Pfizer in April 2021. Pt interested in tx. Informed pt an APP will call back to possibly schedule an appointment. NPs calling form unidentified numbers. Gave pt insurance CPT code [phone] to call insurance company regarding coverage.

## 2020-01-13 ENCOUNTER — Other Ambulatory Visit (HOSPITAL_COMMUNITY): Payer: Self-pay

## 2020-01-13 ENCOUNTER — Ambulatory Visit (HOSPITAL_COMMUNITY)
Admission: RE | Admit: 2020-01-13 | Discharge: 2020-01-13 | Disposition: A | Discharge status: Home / Self Care | Payer: BC Managed Care – PPO | Source: Ambulatory Visit | Attending: Pulmonary Disease | Admitting: Pulmonary Disease

## 2020-01-13 DIAGNOSIS — U071 COVID-19: Secondary | ICD-10-CM | POA: Insufficient documentation

## 2020-01-13 MED ORDER — SODIUM CHLORIDE 0.9 % IV SOLN
500.0000 mg | Freq: Once | INTRAVENOUS | Status: AC
  Administered 2020-01-13: 500 mg via INTRAVENOUS

## 2020-01-13 MED ORDER — SODIUM CHLORIDE 0.9 % IV SOLN: INTRAVENOUS | Status: DC | PRN

## 2020-01-13 MED ORDER — ALBUTEROL SULFATE HFA 108 (90 BASE) MCG/ACT IN AERS: 2.0000 | INHALATION_SPRAY | Freq: Once | RESPIRATORY_TRACT | Status: DC | PRN

## 2020-01-13 MED ORDER — EPINEPHRINE 0.3 MG/0.3ML IJ SOAJ: 0.3000 mg | Freq: Once | INTRAMUSCULAR | Status: DC | PRN

## 2020-01-13 MED ORDER — DIPHENHYDRAMINE HCL 50 MG/ML IJ SOLN: 50.0000 mg | Freq: Once | INTRAMUSCULAR | Status: DC | PRN

## 2020-01-13 MED ORDER — FAMOTIDINE IN NACL 20-0.9 MG/50ML-% IV SOLN: 20.0000 mg | Freq: Once | INTRAVENOUS | Status: DC | PRN

## 2020-01-13 MED ORDER — METHYLPREDNISOLONE SODIUM SUCC 125 MG IJ SOLR: 125.0000 mg | Freq: Once | INTRAMUSCULAR | Status: DC | PRN

## 2020-01-13 NOTE — Progress Notes (Signed)
Patient reviewed Fact Sheet for Patients, Parents, and Caregivers for Emergency Use Authorization (EUA) of Sotrovimab for the Treatment of Coronavirus. Patient also reviewed and is agreeable to the estimated cost of treatment. Patient is agreeable to proceed.   

## 2020-01-13 NOTE — Discharge Instructions (Signed)
10 Things You Can Do to Manage Your COVID-19 Symptoms at Home If you have possible or confirmed COVID-19: 1. Stay home from work and school. And stay away from other public places. If you must go out, avoid using any kind of public transportation, ridesharing, or taxis. 2. Monitor your symptoms carefully. If your symptoms get worse, call your healthcare provider immediately. 3. Get rest and stay hydrated. 4. If you have a medical appointment, call the healthcare provider ahead of time and tell them that you have or may have COVID-19. 5. For medical emergencies, call 911 and notify the dispatch personnel that you have or may have COVID-19. 6. Cover your cough and sneezes with a tissue or use the inside of your elbow. 7. Wash your hands often with soap and water for at least 20 seconds or clean your hands with an alcohol-based hand sanitizer that contains at least 60% alcohol. 8. As much as possible, stay in a specific room and away from other people in your home. Also, you should use a separate bathroom, if available. If you need to be around other people in or outside of the home, wear a mask. 9. Avoid sharing personal items with other people in your household, like dishes, towels, and bedding. 10. Clean all surfaces that are touched often, like counters, tabletops, and doorknobs. Use household cleaning sprays or wipes according to the label instructions. cdc.gov/coronavirus 08/13/2018 This information is not intended to replace advice given to you by your health care provider. Make sure you discuss any questions you have with your health care provider. Document Revised: 01/15/2019 Document Reviewed: 01/15/2019 Elsevier Patient Education  2020 [employer] What types of side effects do monoclonal antibody drugs cause?  Common side effects  In general, the more common side effects caused by monoclonal antibody drugs include: . Allergic reactions, such as hives or itching . Flu-like signs and  symptoms, including chills, fatigue, fever, and muscle aches and pains . Nausea, vomiting . Diarrhea . Skin rashes . Low blood pressure   The CDC is recommending patients who receive monoclonal antibody treatments wait at least 90 days before being vaccinated.  Currently, there are no data on the safety and efficacy of mRNA COVID-19 vaccines in persons who received monoclonal antibodies or convalescent plasma as part of COVID-19 treatment. Based on the estimated half-life of such therapies as well as evidence suggesting that reinfection is uncommon in the 90 days after initial infection, vaccination should be deferred for at least 90 days, as a precautionary measure until additional information becomes available, to avoid interference of the antibody treatment with vaccine-induced immune responses. If you have any questions or concerns after the infusion please call the Advanced Practice Provider on call at [phone]. This number is ONLY intended for your use regarding questions or concerns about the infusion post-treatment side-effects.  Please do not provide this number to others for use. For return to work notes please contact your primary care provider.   If someone you know is interested in receiving treatment please have them call the COVID hotline at [phone].   

## 2020-01-13 NOTE — Progress Notes (Signed)
Diagnosis: COVID-19  Physician: Dr. Patrick Wright  Procedure: [facility] Med: Sotrovimab infusion - Provided patient with sotrovimab fact sheet for patients, parents, and caregivers prior to infusion.   Complications: No immediate complications noted  Discharge: Discharged home
